# Patient Record
Sex: Female | Born: 1970 | Race: Black or African American | Hispanic: No | Smoking: Never smoker
Health system: Southern US, Community
[De-identification: ages and names within clinical notes are randomized; demographics above are authoritative.]

## PROBLEM LIST (undated history)

## (undated) DIAGNOSIS — G43909 Migraine, unspecified, not intractable, without status migrainosus: Secondary | ICD-10-CM

---

## 2016-05-21 ENCOUNTER — Encounter: Payer: Self-pay | Admitting: Emergency Medicine

## 2016-05-21 DIAGNOSIS — R55 Syncope and collapse: Secondary | ICD-10-CM | POA: Diagnosis present

## 2016-05-21 DIAGNOSIS — G9389 Other specified disorders of brain: Secondary | ICD-10-CM | POA: Diagnosis not present

## 2016-05-21 DIAGNOSIS — R42 Dizziness and giddiness: Secondary | ICD-10-CM | POA: Diagnosis not present

## 2016-05-21 DIAGNOSIS — R110 Nausea: Secondary | ICD-10-CM | POA: Diagnosis not present

## 2016-05-21 LAB — CBC
HEMATOCRIT: 41.7 % (ref 35.0–47.0)
HEMOGLOBIN: 14 g/dL (ref 12.0–16.0)
MCH: 31.2 pg (ref 26.0–34.0)
MCHC: 33.7 g/dL (ref 32.0–36.0)
MCV: 92.5 fL (ref 80.0–100.0)
Platelets: 219 10*3/uL (ref 150–440)
RBC: 4.5 MIL/uL (ref 3.80–5.20)
RDW: 13.4 % (ref 11.5–14.5)
WBC: 10.3 10*3/uL (ref 3.6–11.0)

## 2016-05-21 LAB — BASIC METABOLIC PANEL
ANION GAP: 10 (ref 5–15)
BUN: 10 mg/dL (ref 6–20)
CHLORIDE: 105 mmol/L (ref 101–111)
CO2: 24 mmol/L (ref 22–32)
Calcium: 8.8 mg/dL — ABNORMAL LOW (ref 8.9–10.3)
Creatinine, Ser: 0.77 mg/dL (ref 0.44–1.00)
GFR calc Af Amer: >60 mL/min (ref >60)
GFR calc non Af Amer: >60 mL/min (ref >60)
GLUCOSE: 134 mg/dL — AB (ref 65–99)
POTASSIUM: 3 mmol/L — AB (ref 3.5–5.1)
Sodium: 139 mmol/L (ref 135–145)

## 2016-05-21 LAB — GLUCOSE, CAPILLARY: Glucose-Capillary: 120 mg/dL — ABNORMAL HIGH (ref 65–99)

## 2016-05-21 NOTE — ED Triage Notes (Signed)
Pt to triage in wheelchair due to dizziness. Pt reports she became extremely dizzy when laying down for bed tonight, pt also reports nausea but no emesis. Pt is pale in color upon arrival and general weakness noted.

## 2016-05-22 ENCOUNTER — Emergency Department: Payer: Managed Care, Other (non HMO)

## 2016-05-22 ENCOUNTER — Emergency Department
Admission: EM | Admit: 2016-05-22 | Discharge: 2016-05-22 | Disposition: A | Discharge status: Home / Self Care | Payer: Managed Care, Other (non HMO) | Attending: Emergency Medicine | Admitting: Emergency Medicine

## 2016-05-22 DIAGNOSIS — R42 Dizziness and giddiness: Secondary | ICD-10-CM

## 2016-05-22 HISTORY — DX: Migraine, unspecified, not intractable, without status migrainosus: G43.909

## 2016-05-22 LAB — URINALYSIS, COMPLETE (UACMP) WITH MICROSCOPIC
Bilirubin Urine: NEGATIVE
Glucose, UA: NEGATIVE mg/dL
Hgb urine dipstick: NEGATIVE
Ketones, ur: NEGATIVE mg/dL
Nitrite: NEGATIVE
PH: 5 (ref 5.0–8.0)
Protein, ur: NEGATIVE mg/dL
SPECIFIC GRAVITY, URINE: 1.009 (ref 1.005–1.030)

## 2016-05-22 MED ORDER — MECLIZINE HCL 32 MG PO TABS: 32.0000 mg | ORAL_TABLET | Freq: Three times a day (TID) | ORAL | 0 refills | Status: AC | PRN

## 2016-05-22 MED ORDER — MECLIZINE HCL 25 MG PO TABS
25.0000 mg | ORAL_TABLET | Freq: Once | ORAL | Status: AC
  Administered 2016-05-22: 25 mg via ORAL
  Filled 2016-05-22: qty 1

## 2016-05-22 NOTE — ED Notes (Signed)
Patient transported to MRI at this time. 

## 2016-05-22 NOTE — ED Provider Notes (Signed)
[facility] Emergency Department Provider Note   First MD Initiated Contact with Patient 05/22/16 0234     (approximate)  I have reviewed the triage vital signs and the nursing notes.   HISTORY  Chief Complaint Near Syncope   HPI Osie Merkin is a 46 y.o. female presents with acute onset of dizziness tonight. Patient states when she laid down for bed tonight sudden onset of dizziness and nausea. Patient denied any weakness no numbness.   Past Medical History:  Diagnosis Date  . Migraine     There are no active problems to display for this patient.   History reviewed. No pertinent surgical history.  Prior to Admission medications   Not on File    Allergies Benadryl [diphenhydramine hcl]  History reviewed. No pertinent family history.  Social History Social History  Substance Use Topics  . Smoking status: Never Smoker  . Smokeless tobacco: Never Used  . Alcohol use Yes    Review of Systems Constitutional: No fever/chills Eyes: No visual changes. ENT: No sore throat. Cardiovascular: Denies chest pain. Respiratory: Denies shortness of breath. Gastrointestinal: No abdominal pain.  No nausea, no vomiting.  No diarrhea.  No constipation. Genitourinary: Negative for dysuria. Musculoskeletal: Negative for back pain. Skin: Negative for rash. Neurological: Negative for headaches, focal weakness or numbness.Positive for dizziness  10-point ROS otherwise negative.  ____________________________________________   PHYSICAL EXAM:  VITAL SIGNS: ED Triage Vitals  Enc Vitals Group     BP 05/21/16 2216 128/87     Pulse Rate 05/21/16 2216 77     Resp 05/21/16 2216 16     Temp 05/21/16 2216 98.1 F (36.7 C)     Temp Source 05/21/16 2216 Oral     SpO2 05/21/16 2216 100 %     Weight 05/21/16 2213 176 lb (79.8 kg)     Height 05/21/16 2213 5\' 4"  (1.626 m)     Head Circumference --      Peak Flow --      Pain Score 05/22/16 0236 4     Pain  Loc --      Pain Edu? --      Excl. in GC? --     Constitutional: Alert and oriented. Well appearing and in no acute distress. Eyes: Conjunctivae are normal. PERRL. EOMI. Head: Atraumatic. Ears:  Healthy appearing ear canals and TMs bilaterally Nose: No congestion/rhinnorhea. Mouth/Throat: Mucous membranes are moist. Neck: No stridor.  No meningeal signs.  Cardiovascular: Normal rate, regular rhythm. Good peripheral circulation. Grossly normal heart sounds. Respiratory: Normal respiratory effort.  No retractions. Lungs CTAB. Gastrointestinal: Soft and nontender. No distention.  Musculoskeletal: No lower extremity tenderness nor edema. No gross deformities of extremities. Neurologic:  Normal speech and language. No gross focal neurologic deficits are appreciated.  Skin:  Skin is warm, dry and intact. No rash noted. Psychiatric: Mood and affect are normal. Speech and behavior are normal.  ____________________________________________   LABS (all labs ordered are listed, but only abnormal results are displayed)  Labs Reviewed  BASIC METABOLIC PANEL - Abnormal; Notable for the following:       Result Value   Potassium 3.0 (*)    Glucose, Bld 134 (*)    Calcium 8.8 (*)    All other components within normal limits  URINALYSIS, COMPLETE (UACMP) WITH MICROSCOPIC - Abnormal; Notable for the following:    Color, Urine YELLOW (*)    APPearance HAZY (*)    Leukocytes, UA TRACE (*)    Bacteria,  UA RARE (*)    Squamous Epithelial / LPF 6-30 (*)    All other components within normal limits  GLUCOSE, CAPILLARY - Abnormal; Notable for the following:    Glucose-Capillary 120 (*)    All other components within normal limits  CBC  CBG MONITORING, ED   ____________________________________________  EKG  ED ECG REPORT I, [place]eral[place] N Garfield Coiner, the attending physician, personally viewed and interpreted this ECG.   Date: 05/22/2016  EKG Time: 10:15 PM  Rate: 69  Rhythm: Normal sinus rhythm    Axis: Normal  Intervals: Normal  ST&T Change: None  ____________________________________________  RADIOLOGY I, [place] N Abel Ra, personally viewed and evaluated these images (plain radiographs) as part of my medical decision making, as well as reviewing the written report by the radiologist.  Ct Head Wo Contrast  Result Date: 05/22/2016 CLINICAL DATA:  Severe dizziness, evaluate dural calcification versus blood products. EXAM: CT HEAD WITHOUT CONTRAST TECHNIQUE: Contiguous axial images were obtained from the base of the skull through the vertex without intravenous contrast. COMPARISON:  MRI of the head May 22, 2016 at 3:40 a.m. FINDINGS: BRAIN: The ventricles and sulci are normal. No intraparenchymal hemorrhage, mass effect nor midline shift. No acute large vascular territory infarcts. No abnormal extra-axial fluid collections. Basal cisterns are patent. Extensive dural calcifications corresponding to today's MR abnormality. VASCULAR: Unremarkable. SKULL/SOFT TISSUES: Severe temporomandibular osteoarthrosis. No skull fracture. No significant soft tissue swelling. ORBITS/SINUSES: The included ocular globes and orbital contents are normal.The mastoid aircells and included paranasal sinuses are well-aerated. OTHER: None. IMPRESSION: Dural calcifications corresponding to today's MRI abnormality. No acute intracranial process. Empty sella.  Otherwise negative CT HEAD. Electronically Signed   By: Awilda Metro M.D.   On: 05/22/2016 04:12   Mr Maxine Glenn Head Wo Contrast  Result Date: 05/22/2016 CLINICAL DATA:  Acute onset extreme dizziness while in bed tonight, nausea. EXAM: MRI HEAD WITHOUT CONTRAST MRA HEAD WITHOUT CONTRAST TECHNIQUE: Multiplanar, multiecho pulse sequences of the brain and surrounding structures were obtained without intravenous contrast. Angiographic images of the head were obtained using MRA technique without contrast. COMPARISON:  None. FINDINGS: MRI HEAD FINDINGS BRAIN: No reduced  diffusion to suggest acute ischemia. No susceptibility artifact to suggest hemorrhage. The ventricles and sulci are normal for patient's age. A few scattered nonspecific supratentorial white matter FLAIR T2 hyperintensities. No suspicious parenchymal signal, masses or mass effect. No abnormal extra-axial fluid collections. Abnormal bright FLAIR signal superior interhemispheric fissure associated with slight reduced diffusion and susceptibility artifact. Susceptibility artifact extending to the distribution of the associated cortical veins. However, a coronal T2 superior sagittal sinus flow void present. Poor visualization of the sigmoid sinuses. VASCULAR: Normal major intracranial vascular flow voids present at skull base. SKULL AND UPPER CERVICAL SPINE: Empty sella. No suspicious calvarial bone marrow signal. Craniocervical junction maintained. SINUSES/ORBITS: The mastoid air-cells and included paranasal sinuses are well-aerated. The included ocular globes and orbital contents are non-suspicious. OTHER: None. MRA HEAD FINDINGS ANTERIOR CIRCULATION: Normal flow related enhancement of the included cervical, petrous, cavernous and supraclinoid internal carotid arteries. Patent anterior communicating artery. Normal flow related enhancement of the anterior and middle cerebral arteries, including distal segments. No large vessel occlusion, high-grade stenosis, abnormal luminal irregularity, aneurysm. POSTERIOR CIRCULATION: RIGHT vertebral artery is dominant. Basilar artery is patent, with normal flow related enhancement of the main branch vessels. Normal flow related enhancement of the posterior cerebral arteries. No large vessel occlusion, high-grade stenosis, abnormal luminal irregularity, aneurysm. ANATOMIC VARIANTS: None. IMPRESSION: MRI HEAD: Abnormal signal along interhemispheric fissure  and, dural venous sinuses favoring calcifications, less chronic dural venous sinus thrombosis. Noncontrast CT HEAD would help  differentiate calcification versus blood products. Mild white matter changes compatible with chronic small vessel ischemic disease. Empty sella. MRA HEAD: Negative. Acute findings discussed with and reconfirmed by [facility] Tkeya Stencil on 05/22/2016 at 3:55 am. Electronically Signed   By: Awilda Metro M.D.   On: 05/22/2016 03:56   Mr Brain Wo Contrast  Result Date: 05/22/2016 CLINICAL DATA:  Acute onset extreme dizziness while in bed tonight, nausea. EXAM: MRI HEAD WITHOUT CONTRAST MRA HEAD WITHOUT CONTRAST TECHNIQUE: Multiplanar, multiecho pulse sequences of the brain and surrounding structures were obtained without intravenous contrast. Angiographic images of the head were obtained using MRA technique without contrast. COMPARISON:  None. FINDINGS: MRI HEAD FINDINGS BRAIN: No reduced diffusion to suggest acute ischemia. No susceptibility artifact to suggest hemorrhage. The ventricles and sulci are normal for patient's age. A few scattered nonspecific supratentorial white matter FLAIR T2 hyperintensities. No suspicious parenchymal signal, masses or mass effect. No abnormal extra-axial fluid collections. Abnormal bright FLAIR signal superior interhemispheric fissure associated with slight reduced diffusion and susceptibility artifact. Susceptibility artifact extending to the distribution of the associated cortical veins. However, a coronal T2 superior sagittal sinus flow void present. Poor visualization of the sigmoid sinuses. VASCULAR: Normal major intracranial vascular flow voids present at skull base. SKULL AND UPPER CERVICAL SPINE: Empty sella. No suspicious calvarial bone marrow signal. Craniocervical junction maintained. SINUSES/ORBITS: The mastoid air-cells and included paranasal sinuses are well-aerated. The included ocular globes and orbital contents are non-suspicious. OTHER: None. MRA HEAD FINDINGS ANTERIOR CIRCULATION: Normal flow related enhancement of the included cervical, petrous, cavernous and  supraclinoid internal carotid arteries. Patent anterior communicating artery. Normal flow related enhancement of the anterior and middle cerebral arteries, including distal segments. No large vessel occlusion, high-grade stenosis, abnormal luminal irregularity, aneurysm. POSTERIOR CIRCULATION: RIGHT vertebral artery is dominant. Basilar artery is patent, with normal flow related enhancement of the main branch vessels. Normal flow related enhancement of the posterior cerebral arteries. No large vessel occlusion, high-grade stenosis, abnormal luminal irregularity, aneurysm. ANATOMIC VARIANTS: None. IMPRESSION: MRI HEAD: Abnormal signal along interhemispheric fissure and, dural venous sinuses favoring calcifications, less chronic dural venous sinus thrombosis. Noncontrast CT HEAD would help differentiate calcification versus blood products. Mild white matter changes compatible with chronic small vessel ischemic disease. Empty sella. MRA HEAD: Negative. Acute findings discussed with and reconfirmed by [facility] Joelyn Lover on 05/22/2016 at 3:55 am. Electronically Signed   By: Awilda Metro M.D.   On: 05/22/2016 03:56     Procedures    INITIAL IMPRESSION / ASSESSMENT AND PLAN / ED COURSE  Pertinent labs & imaging results that were available during my care of the patient were reviewed by me and considered in my medical decision making (see chart for details).  46 year old female presenting with acute onset of dizziness tonight. Concern for central versus peripheral etiology of vertigo CT scan and MRI of the patient's brain were negative and the MRI was concerning to the radiologist possible calcification versus thrombosis and a such she requested a CT scan of the performed at which point I was informed that it was calcifications. Patient's dizziness completely resolved status post meclizine      ____________________________________________  FINAL CLINICAL IMPRESSION(S) / ED DIAGNOSES  Final diagnoses:   Dizziness     MEDICATIONS GIVEN DURING THIS VISIT:  Medications  meclizine (ANTIVERT) tablet 25 mg (25 mg Oral Given 05/22/16 0241)     NEW OUTPATIENT MEDICATIONS STARTED  DURING THIS VISIT:  New Prescriptions   No medications on file    Modified Medications   No medications on file    Discontinued Medications   No medications on file     Note:  This document was prepared using Dragon voice recognition software and may include unintentional dictation errors.    Darci Currentandolph N Oseph Imburgia, MD 05/22/16 [phone]

## 2018-01-18 IMAGING — CT CT HEAD W/O CM
3 series · 15 of 46 positions shown, 18 images · non-contrast
Comparison: MRI of the head May 22, 2016 at [DATE] a.m.

CLINICAL DATA: Severe dizziness, evaluate dural calcification
versus blood products.

EXAM:
CT HEAD WITHOUT CONTRAST
TECHNIQUE: Contiguous axial images were obtained from the base of the skull
through the vertex without intravenous contrast.

[Series 2: head wo · axial · 0.41mm/px · z∈[-154,-34]mm · 9 of 29 slices shown, 12 images]
[im 3/29  brain]
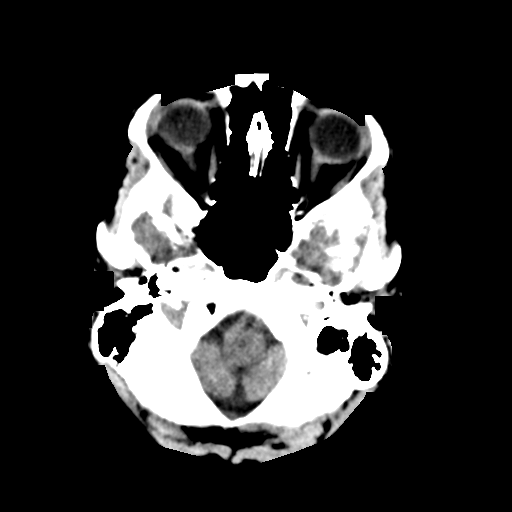
[im 3/29  bone]
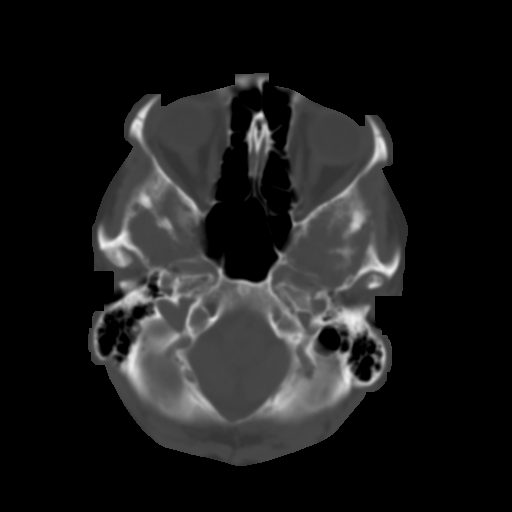
[im 6/29  brain]
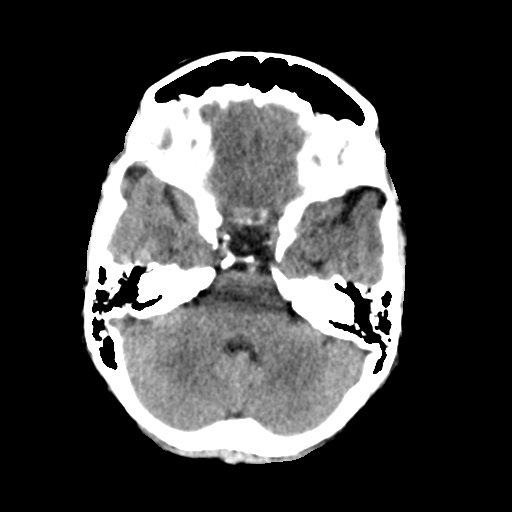
[im 9/29  brain]
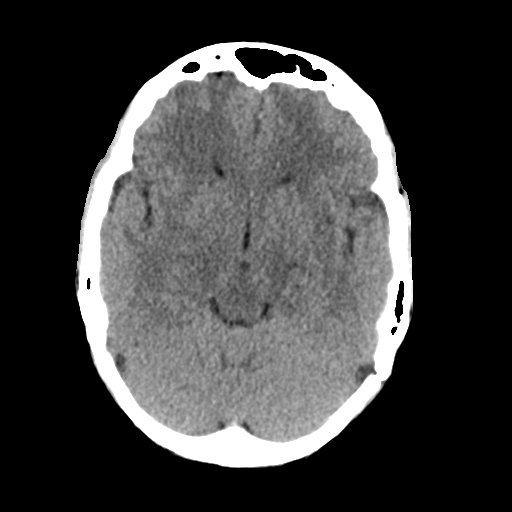
[im 12/29  brain]
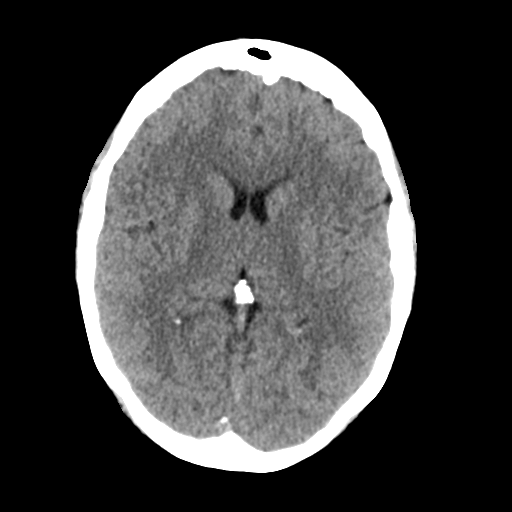
[im 15/29  brain]
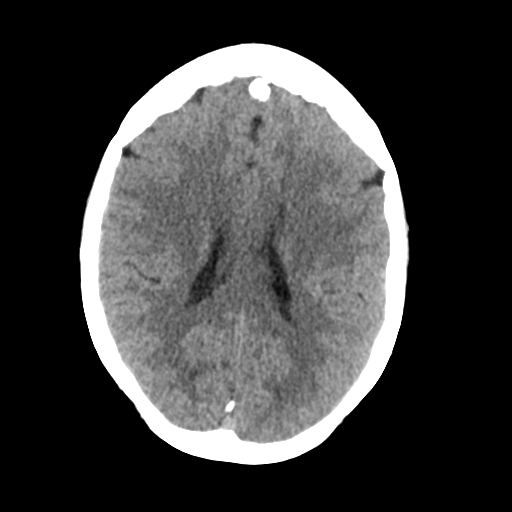
[im 15/29  bone]
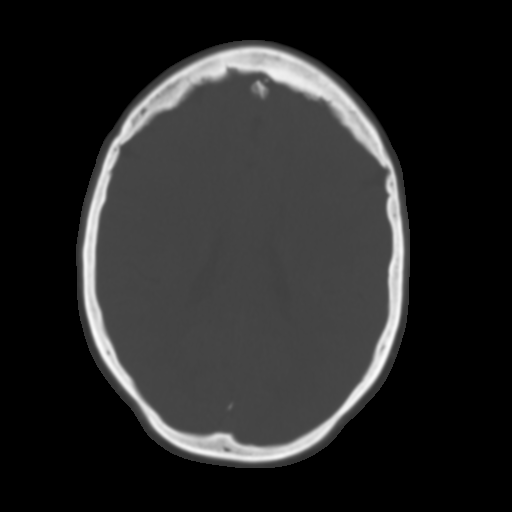
[im 18/29  brain]
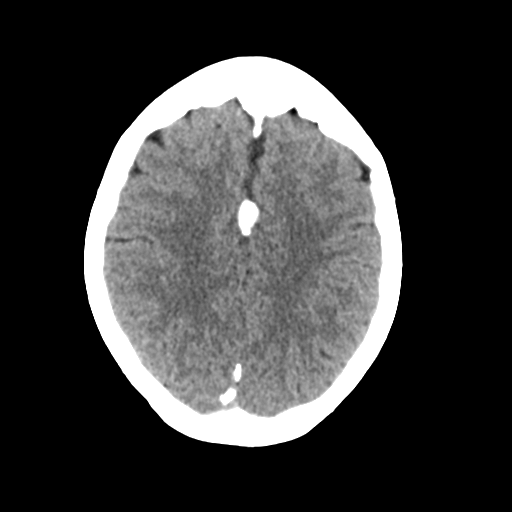
[im 21/29  brain]
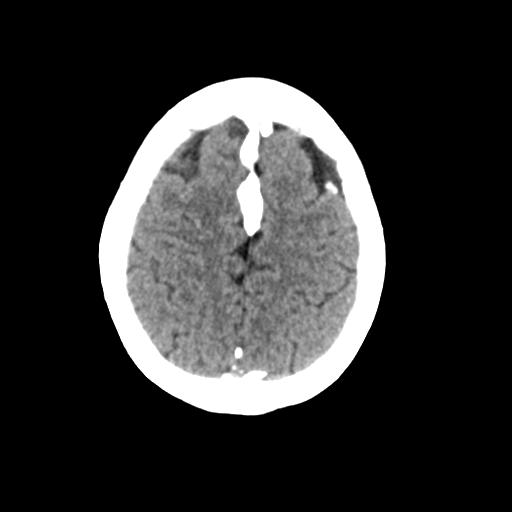
[im 24/29  brain]
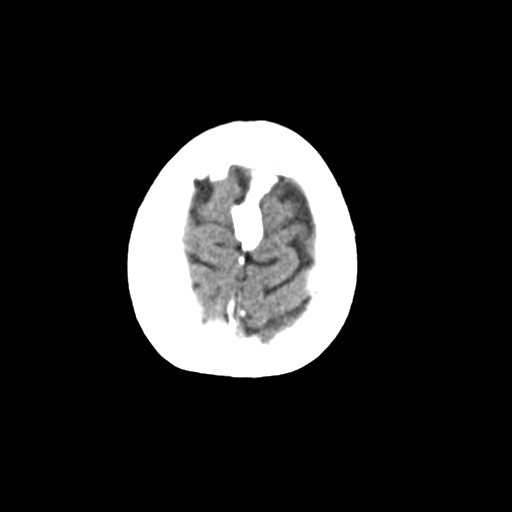
[im 27/29  brain]
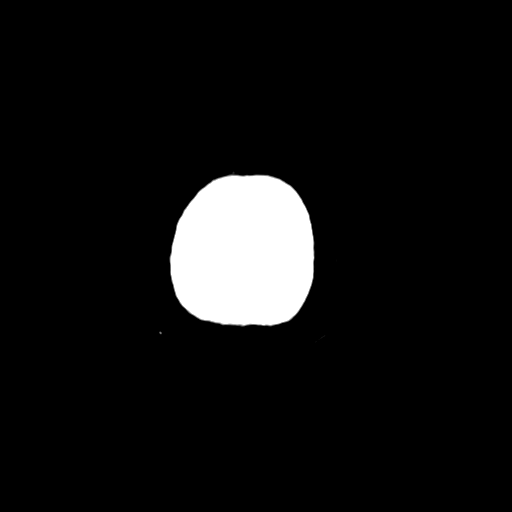
[im 27/29  bone]
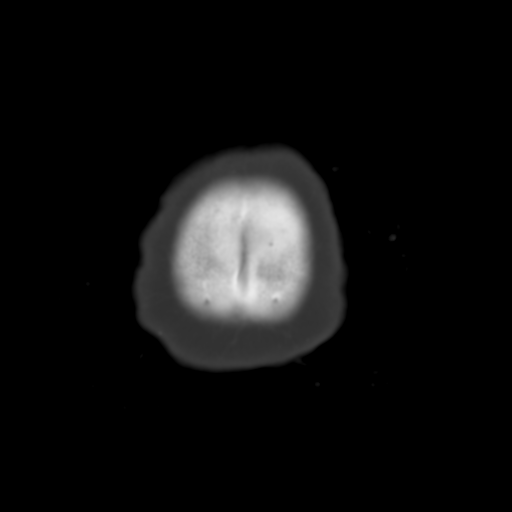

[Series 4: coronal soft tissue · coronal · 0.30mm/px · 3 of 63 slices shown]
[im 21/63  brain]
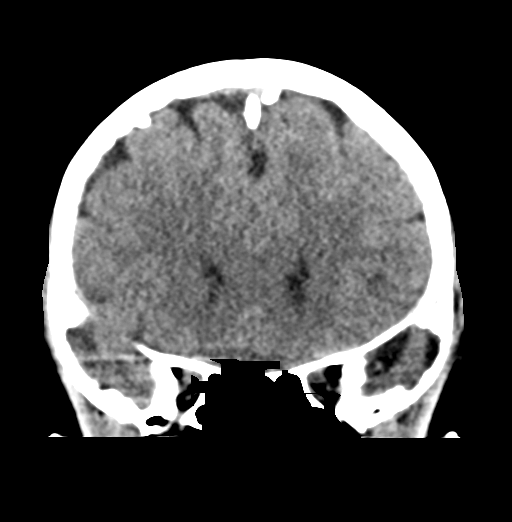
[im 28/63  brain]
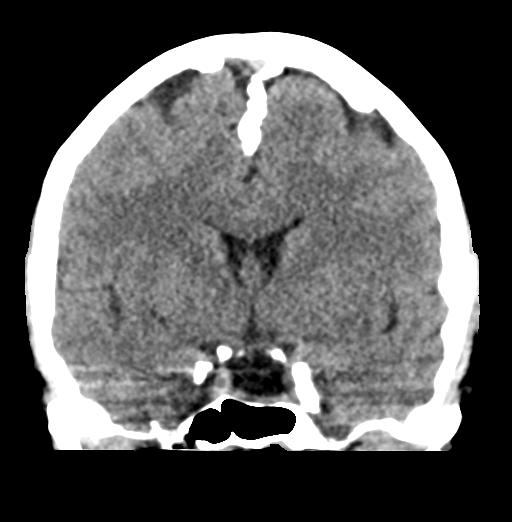
[im 35/63  brain]
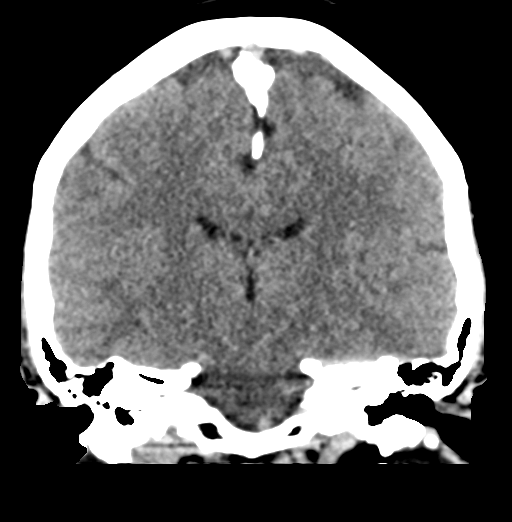

[Series 5: sagittal soft tissue · sagittal · 0.28mm/px · 3 of 51 slices shown]
[im 17/51  brain]
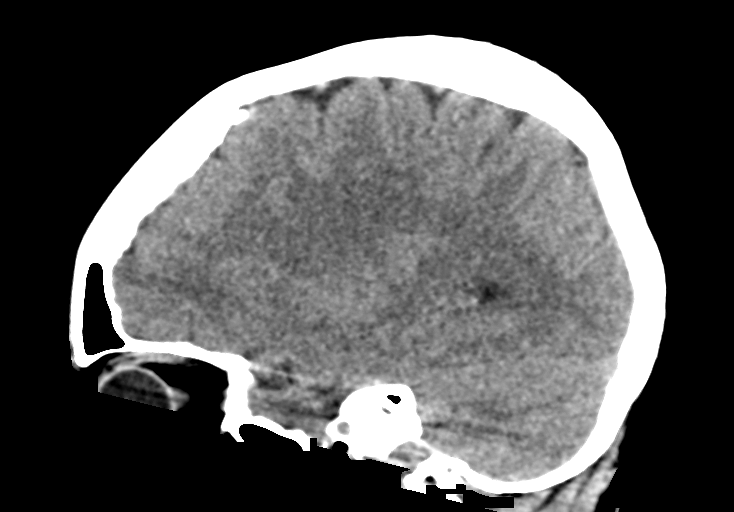
[im 26/51  brain]
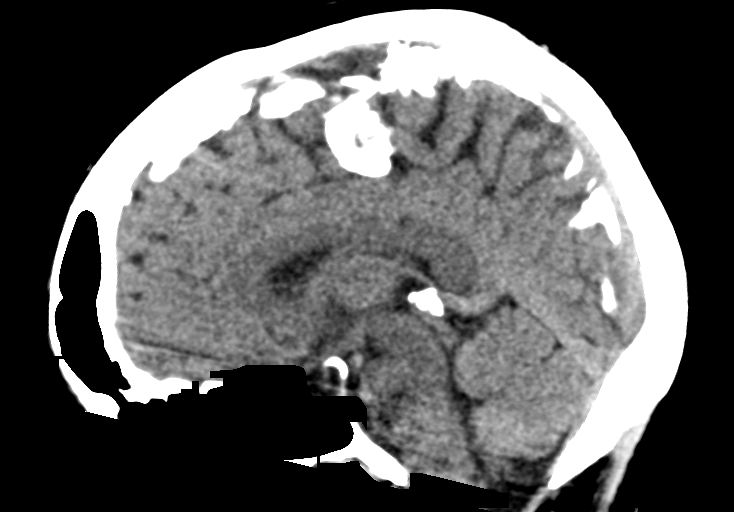
[im 34/51  brain]
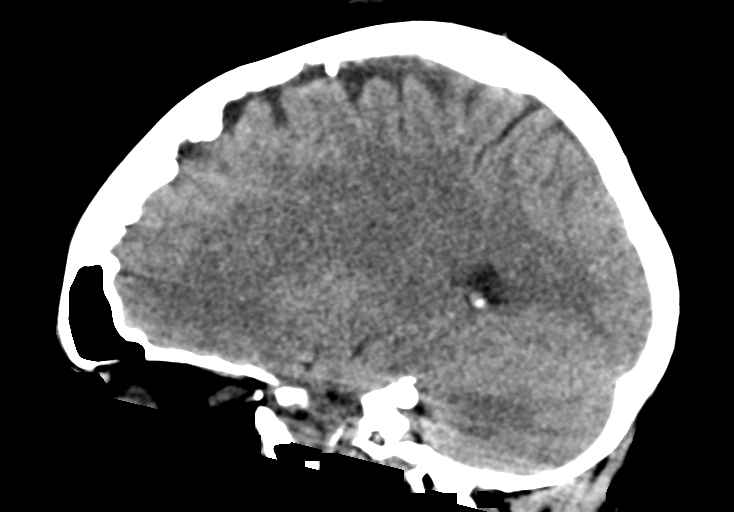

[15 of 46 positions shown; findings below may reference images not displayed]

FINDINGS: BRAIN: The ventricles and sulci are normal. No intraparenchymal
hemorrhage, mass effect nor midline shift. No acute large vascular
territory infarcts. No abnormal extra-axial fluid collections. Basal
cisterns are patent. Extensive dural calcifications corresponding to
today's MR abnormality.

VASCULAR: Unremarkable.

SKULL/SOFT TISSUES: Severe temporomandibular osteoarthrosis. No
skull fracture. No significant soft tissue swelling.

ORBITS/SINUSES: The included ocular globes and orbital contents are
normal.The mastoid aircells and included paranasal sinuses are
well-aerated.

OTHER: None.
IMPRESSION: Dural calcifications corresponding to today's MRI abnormality. No
acute intracranial process.

Empty sella.  Otherwise negative CT HEAD.
